# Patient Record
Sex: Male | Born: 1985 | Race: White | Hispanic: Yes | Marital: Single | Smoking: Never smoker
Health system: Southern US, Community
[De-identification: ages and names within clinical notes are randomized; demographics above are authoritative.]

## PROBLEM LIST (undated history)

## (undated) HISTORY — PX: APPENDECTOMY: SHX54

---

## 2016-01-19 ENCOUNTER — Emergency Department (HOSPITAL_COMMUNITY): Payer: 59

## 2016-01-19 ENCOUNTER — Emergency Department (HOSPITAL_COMMUNITY)
Admission: EM | Admit: 2016-01-19 | Discharge: 2016-01-19 | Disposition: A | Discharge status: Home / Self Care | Payer: 59 | Attending: Emergency Medicine | Admitting: Emergency Medicine

## 2016-01-19 ENCOUNTER — Encounter (HOSPITAL_COMMUNITY): Payer: Self-pay

## 2016-01-19 DIAGNOSIS — S3991XA Unspecified injury of abdomen, initial encounter: Secondary | ICD-10-CM | POA: Insufficient documentation

## 2016-01-19 DIAGNOSIS — R51 Headache: Secondary | ICD-10-CM | POA: Diagnosis not present

## 2016-01-19 DIAGNOSIS — Y92410 Unspecified street and highway as the place of occurrence of the external cause: Secondary | ICD-10-CM | POA: Diagnosis not present

## 2016-01-19 DIAGNOSIS — Y999 Unspecified external cause status: Secondary | ICD-10-CM | POA: Insufficient documentation

## 2016-01-19 DIAGNOSIS — Y939 Activity, unspecified: Secondary | ICD-10-CM | POA: Insufficient documentation

## 2016-01-19 MED ORDER — IOPAMIDOL (ISOVUE-300) INJECTION 61%
INTRAVENOUS | Status: AC
  Administered 2016-01-19: 100 mL
  Filled 2016-01-19: qty 75

## 2016-01-19 MED ORDER — IOPAMIDOL (ISOVUE-370) INJECTION 76%
INTRAVENOUS | Status: AC
  Administered 2016-01-19: 50 mL
  Filled 2016-01-19: qty 50

## 2016-01-19 MED ORDER — IOPAMIDOL (ISOVUE-300) INJECTION 61%
INTRAVENOUS | Status: AC
  Filled 2016-01-19: qty 30

## 2016-01-19 NOTE — ED Notes (Signed)
Patient transported to X-ray 
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
## 2016-01-19 NOTE — ED Notes (Signed)
Pt returned from CT °

## 2016-01-19 NOTE — ED Notes (Signed)
Pt transported to CT ?

## 2016-01-19 NOTE — ED Notes (Addendum)
Pt stated that his neck, back of his head, knees and fingers are hurting.

## 2016-01-19 NOTE — Discharge Instructions (Signed)
Take 4 over the counter ibuprofen tablets 3 times a day or 2 over-the-counter naproxen tablets twice a day for pain. Also take tylenol 1000mg(2 extra strength) four times a day.    

## 2016-01-19 NOTE — ED Notes (Signed)
Pt. returned from XR. 

## 2016-01-19 NOTE — ED Notes (Signed)
Per NT, before transported to XR pt was requesting medication for pain. Upon the pt leaving the room, per NT the pt's girlfriend stated "don't given him any pain medication, he's on xanax right now."

## 2016-02-20 DEATH — deceased
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
